# Patient Record
Sex: Female | Born: 1970 | Race: White | Hispanic: Yes | Marital: Single | Smoking: Never smoker
Health system: Southern US, Community
[De-identification: ages and names within clinical notes are randomized; demographics above are authoritative.]

---

## 2005-05-16 ENCOUNTER — Observation Stay: Payer: Self-pay

## 2008-01-18 ENCOUNTER — Encounter: Payer: Self-pay | Admitting: Maternal & Fetal Medicine

## 2008-05-31 ENCOUNTER — Inpatient Hospital Stay: Payer: Self-pay | Admitting: Obstetrics and Gynecology

## 2012-11-17 DIAGNOSIS — H9319 Tinnitus, unspecified ear: Secondary | ICD-10-CM | POA: Insufficient documentation

## 2014-04-20 ENCOUNTER — Ambulatory Visit: Payer: Self-pay

## 2014-05-31 ENCOUNTER — Ambulatory Visit: Payer: Self-pay

## 2014-10-07 ENCOUNTER — Other Ambulatory Visit: Payer: Self-pay | Admitting: Oncology

## 2014-10-07 DIAGNOSIS — N63 Unspecified lump in unspecified breast: Secondary | ICD-10-CM

## 2014-12-01 ENCOUNTER — Ambulatory Visit
Admission: RE | Admit: 2014-12-01 | Discharge: 2014-12-01 | Disposition: A | Discharge status: Home / Self Care | Payer: Self-pay | Source: Ambulatory Visit | Attending: Oncology | Admitting: Oncology

## 2014-12-01 DIAGNOSIS — N63 Unspecified lump in unspecified breast: Secondary | ICD-10-CM

## 2014-12-02 NOTE — Progress Notes (Signed)
Patients 6 month follow-up mammogram and ultrasound results, Birads 3.  Will schedule annual [facility] appointment with Bilateral diagnostic mammogram, and right breast ultrasound for probable benign right breast mass.  Copy to HSIS.

## 2015-04-26 ENCOUNTER — Ambulatory Visit
Admission: RE | Admit: 2015-04-26 | Discharge: 2015-04-26 | Disposition: A | Discharge status: Home / Self Care | Payer: 59 | Source: Ambulatory Visit | Attending: Oncology | Admitting: Oncology

## 2015-04-26 ENCOUNTER — Encounter: Payer: Self-pay | Admitting: *Deleted

## 2015-04-26 ENCOUNTER — Ambulatory Visit: Payer: Self-pay | Attending: Oncology | Admitting: *Deleted

## 2015-04-26 VITALS — BP 122/76 | HR 67 | Temp 96.3°F | Resp 18 | Ht 62.01 in | Wt 187.6 lb

## 2015-04-26 DIAGNOSIS — N63 Unspecified lump in unspecified breast: Secondary | ICD-10-CM

## 2015-04-26 NOTE — Progress Notes (Signed)
Letter mailed with appointment for next mammogram in one year per radiology recommendations.

## 2015-04-26 NOTE — Patient Instructions (Signed)
Gave patient hand-out, Women Staying Healthy, Active and Well from [facility], with education on breast health, pap smears, heart and colon health. 

## 2015-04-26 NOTE — Progress Notes (Signed)
Subjective:     Patient ID: Raymondo BandVioleta Zuniga Ramirez, female   DOB: 06/08/1971, 44 y.o.   MRN: [id number]  HPI   Review of Systems     Objective:   Physical Exam  Pulmonary/Chest: Right breast exhibits no inverted nipple, no mass, no nipple discharge, no skin change and no tenderness. Left breast exhibits no inverted nipple, no mass, no nipple discharge, no skin change and no tenderness. Breasts are symmetrical.       Assessment:     44 year old Hispanic [facility] and 6 month follow-up mammogram.  Lloyda, the interpreter present during the interview and exam.  Last mammogram on 12/01/14 was a birads 3.  On clinical breast exam there is no dominant mass, skin changes, nipple discharge or lymphadenopathy.  Taught self breast awareness.  Patient's mom with history of ovarian cancer at age 45.  Patient encouraged to get annual mammograms.  Patient has been screened for eligibility.  She does not have any insurance, Medicare or Medicaid.  She also meets financial eligibility.  Hand-out given on the Affordable Care Act.     Plan:     Bilateral diagnostic mammogram and ultrasound ordered for 6 month follow up right breast nodule and annual exam.  Will follow up per [facility] protocol.  Next pap due 2017.

## 2016-04-24 ENCOUNTER — Ambulatory Visit
Admission: RE | Admit: 2016-04-24 | Discharge: 2016-04-24 | Disposition: A | Discharge status: Home / Self Care | Payer: Self-pay | Source: Ambulatory Visit | Attending: Oncology | Admitting: Oncology

## 2016-04-24 ENCOUNTER — Other Ambulatory Visit: Payer: Self-pay

## 2016-04-24 ENCOUNTER — Encounter: Payer: Self-pay | Admitting: *Deleted

## 2016-04-24 ENCOUNTER — Ambulatory Visit: Payer: Self-pay | Attending: Oncology | Admitting: *Deleted

## 2016-04-24 VITALS — BP 109/75 | HR 61 | Temp 97.7°F | Ht 62.21 in | Wt 188.2 lb

## 2016-04-24 DIAGNOSIS — N63 Unspecified lump in unspecified breast: Secondary | ICD-10-CM

## 2016-04-24 NOTE — Patient Instructions (Signed)
Gave patient hand-out, Women Staying Healthy, Active and Well from [facility], with education on breast health, pap smears, heart and colon health. 

## 2016-04-24 NOTE — Progress Notes (Signed)
Subjective:     Patient ID: Ann Wallace, female   DOB: 12/15/1970, 45 y.o.   MRN: [id number]  HPI   Review of Systems     Objective:   Physical Exam  Pulmonary/Chest: Right breast exhibits no inverted nipple, no mass, no nipple discharge, no skin change and no tenderness. Left breast exhibits no inverted nipple, no mass, no nipple discharge, no skin change and no tenderness. Breasts are symmetrical.       Assessment:     45 [facility] Hispanic [facility]  Last mammogram 04/26/15 was a birads 3.  Lloyda the interpreter present during the interview and exam.  On clinical breast exam there is no dominant mass, skin changes, nipple discharge or lymphadenopathy.  She does complain of tenderness in the left breast.  Taught self breast awareness.  Patient with significant family history of cancer.  Her mom was diagnosed with ovarian cancer at age 46, and her sister was just diagnosed with breast cancer at age 46.  Discussed need for possible genetic testing based on family history.  She is to talk with her sister to see if she has been tested.  She is to call me back, and we will discuss possibility of getting her tested through [facility].  [facility] has a program for testing the uninsured.  Will discuss with Dr. Orlie DakinFinnegan, our medical director to see if he would agree to test her and set up a medical management plan if necessary.  Patient has been screened for eligibility.  She does not have any insurance, Medicare or Medicaid.  She also meets financial eligibility.  Hand-out given on the Affordable Care Act.    Plan:     Bilateral diagnostic mammogram with ultrasound ordered for birads 3 follow-up.  Will discuss genetic testing when patient calls me back.  Will follow-up per [facility] protocol.

## 2016-05-07 ENCOUNTER — Encounter: Payer: Self-pay | Admitting: *Deleted

## 2016-05-07 NOTE — Progress Notes (Signed)
Letter mailed to inform patient of her normal mammogram.  She is to follow-up in one year with annual screening.  HSIS to Christy. 

## 2017-04-30 ENCOUNTER — Encounter: Payer: Self-pay | Admitting: *Deleted

## 2017-04-30 ENCOUNTER — Ambulatory Visit: Payer: Self-pay | Attending: Oncology | Admitting: *Deleted

## 2017-04-30 ENCOUNTER — Ambulatory Visit
Admission: RE | Admit: 2017-04-30 | Discharge: 2017-04-30 | Disposition: A | Discharge status: Home / Self Care | Payer: Self-pay | Source: Ambulatory Visit | Attending: Oncology | Admitting: Oncology

## 2017-04-30 VITALS — BP 139/92 | HR 71 | Temp 97.6°F | Resp 18 | Ht 62.0 in | Wt 193.0 lb

## 2017-04-30 DIAGNOSIS — Z0000 Encounter for general adult medical examination without abnormal findings: Secondary | ICD-10-CM

## 2017-04-30 NOTE — Patient Instructions (Signed)
Hipertensin Hypertension La hipertensin, conocida comnmente como presin arterial alta, se produce cuando la sangre bombea en las arterias con mucha fuerza. Las arterias son los vasos sanguneos que transportan la sangre desde el corazn al resto del cuerpo. La hipertensin hace que el corazn haga ms esfuerzo para bombear sangre y puede provocar que las arterias se estrechen o endurezcan. La hipertensin no tratada o no controlada puede causar infarto de miocardio, accidentes cerebrovasculares, enfermedad renal y otros problemas. Una lectura de la presin arterial consiste de un nmero ms alto sobre un nmero ms bajo. En condiciones ideales, la presin arterial debe estar por debajo de 120/80. El primer nmero ("superior") es la presin sistlica. Es la medida de la presin de las arterias cuando el corazn late. El segundo nmero ("inferior") es la presin diastlica. Es la medida de la presin en las arterias cuando el corazn se relaja. Cules son las causas? Se desconoce la causa de esta afeccin. Qu incrementa el riesgo? Algunos factores de riesgo de hipertensin estn bajo su control. Otros no. Factores que puede modificar  Fumar.  Tener diabetes mellitus tipo 2, colesterol alto, o ambos.  No hacer la cantidad suficiente de actividad fsica o ejercicio.  Tener sobrepeso.  Consumir mucha grasa, azcar, caloras o sal (sodio) en su dieta.  Beber alcohol en exceso. Factores que son difciles o imposibles de modificar  Tener enfermedad renal crnica.  Tener antecedentes familiares de presin arterial alta.  La edad. Los riesgos aumentan con la edad.  La raza. El riesgo es mayor para las personas afroamericanas.  El sexo. Antes de los 45aos, los hombres corren ms riesgo que las mujeres. Despus de los 65aos, las mujeres corren ms riesgo que los hombres.  Tener apnea obstructiva del sueo.  El estrs. Cules son los signos o los sntomas? La presin arterial  extremadamente alta (crisis hipertensiva) puede provocar:  Dolor de cabeza.  Ansiedad.  Falta de aire.  Hemorragia nasal.  Nuseas y vmitos.  Dolor de pecho intenso.  Una crisis de movimientos que no puede controlar (convulsiones).  Cmo se diagnostica? Esta afeccin se diagnostica midiendo su presin arterial mientras se encuentra sentado, con el brazo apoyado sobre una superficie. El brazalete del tensimetro debe colocarse directamente sobre la piel de la parte superior del brazo y al nivel de su corazn. Debe medirla al menos dos veces en el mismo brazo. Determinadas condiciones pueden causar una diferencia de presin arterial entre el brazo izquierdo y el derecho. Ciertos factores pueden provocar que las lecturas de la presin arterial sean inferiores o superiores a lo normal (elevadas) por un perodo corto de tiempo:  Si su presin arterial es ms alta cuando se encuentra en el consultorio del mdico que cuando la mide en su hogar, se denomina "hipertensin de bata blanca". La mayora de las personas que tienen esta afeccin no deben ser medicadas.  Si su presin arterial es ms alta en el hogar que cuando se encuentra en el consultorio del mdico, se denomina "hipertensin enmascarada". La mayora de las personas que tienen esta afeccin deben ser medicadas para controlar la presin arterial.  Si tiene una lecturas de presin arterial alta durante una visita o si tiene presin arterial normal con otros factores de riesgo:  Es posible que se le pida que regrese otro da para volver a controlar su presin arterial.  Se le puede pedir que se controle la presin arterial en su casa durante 1 semana o ms.  Si se le diagnostica hipertensin, es posible que   se le realicen otros anlisis de sangre o estudios de diagnstico por imgenes para ayudar a su mdico a comprender su riesgo general de tener otras afecciones. Cmo se trata? Esta afeccin se trata haciendo cambios saludables  en el estilo de vida, tales como ingerir alimentos saludables, realizar ms ejercicio y reducir el consumo de alcohol. El mdico puede recetarle medicamentos si los cambios en el estilo de vida no son suficientes para lograr controlar la presin arterial y si:  Su presin arterial sistlica est por encima de 130.  Su presin arterial diastlica est por encima de 80.  La presin arterial deseada puede variar en funcin de las enfermedades, la edad y otros factores personales. Siga estas instrucciones en su casa: Comida y bebida  Siga una dieta con alto contenido de fibras y potasio, y con bajo contenido de sodio, azcar agregada y grasas. Un ejemplo de plan alimenticio es la dieta DASH (Dietary Approaches to Stop Hypertension, Mtodos alimenticios para detener la hipertensin). Para alimentarse de esta manera: ? Coma mucha fruta y verdura fresca. Trate de que la mitad del plato de cada comida sea de frutas y verduras. ? Coma cereales integrales, como pasta integral, arroz integral y pan integral. Llene aproximadamente un cuarto del plato con cereales integrales. ? Coma y beba productos lcteos con bajo contenido de grasa, como leche descremada o yogur bajo en grasas. ? Evite la ingesta de cortes de carne grasa, carne procesada o curada, y carne de ave con piel. Llene aproximadamente un cuarto del plato con protenas magras, como pescado, pollo sin piel, frijoles, huevos y tofu. ? Evite ingerir alimentos prehechos o procesados. En general, estos tienen mayor cantidad de sodio, azcar agregada y grasa.  Reduzca su ingesta diaria de sodio. La mayora de las personas que tienen hipertensin deben comer menos de 1500 mg de sodio por da.  Limite el consumo de alcohol a no ms de 1 medida por da si es mujer y no est embarazada y a 2 medidas por da si es hombre. Una medida equivale a 12onzas de cerveza, 5onzas de vino o 1onzas de bebidas alcohlicas de alta graduacin. Estilo de vida  Trabaje  con su mdico para mantener un peso saludable o perder peso. Pregntele cual es su peso recomendado.  Realice al menos 30 minutos de ejercicio que haga que se acelere su corazn (ejercicio aerbico) la mayora de los das de la semana. Estas actividades pueden incluir caminar, nadar o andar en bicicleta.  Incluya ejercicios para fortalecer sus msculos (ejercicios de resistencia), como pilates o levantamiento de pesas, como parte de su rutina semanal de ejercicios. Intente realizar 30minutos de este tipo de ejercicios al menos tres das a la semana.  No consuma ningn producto que contenga nicotina o tabaco, como cigarrillos y cigarrillos electrnicos. Si necesita ayuda para dejar de fumar, consulte al mdico.  Contrlese la presin arterial en su casa segn las indicaciones del mdico.  Concurra a todas las visitas de control como se lo haya indicado el mdico. Esto es importante. Medicamentos  Tome los medicamentos de venta libre y los recetados solamente como se lo haya indicado el mdico. Siga cuidadosamente las indicaciones. Los medicamentos para la presin arterial deben tomarse segn las indicaciones.  No omita las dosis de medicamentos para la presin arterial. Si lo hace, estar en riesgo de tener problemas y puede hacer que los medicamentos sean menos eficaces.  Pregntele a su mdico a qu efectos secundarios o reacciones a los medicamentos debe prestar atencin. Comunquese con   un mdico si:  Piensa que tiene una reaccin a un medicamento que est tomando.  Tiene dolores de cabeza frecuentes (recurrentes).  Siente mareos.  Tiene hinchazn en los tobillos.  Tiene problemas de visin. Solicite ayuda de inmediato si:  Siente un dolor de cabeza intenso o confusin.  Siente debilidad inusual o adormecimiento.  Siente que va a desmayarse.  Siente un dolor intenso en el pecho o el abdomen.  Vomita repetidas veces.  Tiene dificultad para respirar. Resumen  La  hipertensin se produce cuando la sangre bombea en las arterias con mucha fuerza. Si esta afeccin no se controla, podra correr riesgo de tener complicaciones graves.  La presin arterial deseada puede variar en funcin de las enfermedades, la edad y otros factores personales. Para la mayora de las personas, una presin arterial normal es menor que 120/80.  La hipertensin se trata con cambios en el estilo de vida, medicamentos o una combinacin de ambos. Los cambios en el estilo de vida incluyen prdida de peso, ingerir alimentos sanos, seguir una dieta baja en sodio, hacer ms ejercicio y limitar el consumo de alcohol. Esta informacin no tiene como fin reemplazar el consejo del mdico. Asegrese de hacerle al  mdico cualquier pregunta que tenga. Document Released: 06/10/2005 Document Revised: 05/22/2016 Document Reviewed: 05/22/2016 Elsevier Interactive Patient Education  2018 [employer]vier Inc.  Gave patient hand-out, Women Staying Healthy, Active and Well from [facility], with education on breast health, pap smears, heart and colon health. 

## 2017-04-30 NOTE — Progress Notes (Signed)
Subjective:     Patient ID: Raymondo BandVioleta Zuniga Ramirez, female   DOB: 01/17/1971, 46 y.o.   MRN: [id number]  HPI   Review of Systems     Objective:   Physical Exam  Pulmonary/Chest: Right breast exhibits tenderness. Right breast exhibits no inverted nipple, no mass, no nipple discharge and no skin change. Left breast exhibits tenderness. Left breast exhibits no mass, no nipple discharge and no skin change. Breasts are symmetrical.    Bilateral tenderness on palpation       Assessment:     46 [facility] Hispanic [facility]  Lloyda, the interpreter present during the interview and exam.  Clinical breast exam unremarkable.  Taught self breast awareness.  Patient states she has bilateral tenderness, but only to palpation.  Family history of breast cancer in her sister at age 47, and her mom with uterine cancer at age 47.  States she did ask her sister if she had genetic testing and she did not have any testing completed.  Encouraged annual screening and monthly self breast awareness due to increased risk of breast cancer.  She is agreeable. Last pap on 11/14/14 was negative with no HPV co-testing.  Informed next pap will be due in 2019.  Patient has been screened for eligibility.  She does not have any insurance, Medicare or Medicaid.  She also meets financial eligibility.  Hand-out given on the Affordable Care Act.    Plan:     Screening mammogram ordered.  Will follow-up per [facility] protocol.

## 2017-05-06 ENCOUNTER — Encounter: Payer: Self-pay | Admitting: *Deleted

## 2017-05-06 NOTE — Progress Notes (Signed)
Letter mailed from [facility] to inform patient of her normal mammogram results.  Patient is to follow-up with annual screening in one year.  [facility]IS to Christy. 

## 2018-05-25 ENCOUNTER — Ambulatory Visit: Payer: Self-pay | Attending: Oncology | Admitting: *Deleted

## 2018-05-25 ENCOUNTER — Encounter (INDEPENDENT_AMBULATORY_CARE_PROVIDER_SITE_OTHER): Payer: Self-pay

## 2018-05-25 ENCOUNTER — Encounter: Payer: Self-pay | Admitting: *Deleted

## 2018-05-25 ENCOUNTER — Ambulatory Visit
Admission: RE | Admit: 2018-05-25 | Discharge: 2018-05-25 | Disposition: A | Discharge status: Home / Self Care | Payer: 59 | Source: Ambulatory Visit | Attending: Oncology | Admitting: Oncology

## 2018-05-25 VITALS — BP 111/75 | HR 76 | Temp 98.0°F

## 2018-05-25 DIAGNOSIS — Z0000 Encounter for general adult medical examination without abnormal findings: Secondary | ICD-10-CM

## 2018-05-25 NOTE — Patient Instructions (Signed)
Gave patient hand-out, Women Staying Healthy, Active and Well from [facility], with education on breast health, pap smears, heart and colon health. 

## 2018-05-25 NOTE — Progress Notes (Signed)
  Subjective:     Patient ID: Ann Wallace, female   DOB: 03/24/1971, 47 y.o.   MRN: [id number]  HPI   Review of Systems     Objective:   Physical Exam  Pulmonary/Chest: Right breast exhibits no inverted nipple, no mass, no nipple discharge, no skin change and no tenderness. Left breast exhibits no inverted nipple, no mass, no nipple discharge, no skin change and no tenderness.       Assessment:     47 [facility] Hispanic [facility]  Ann Wallace the interpreter present during the interview and exam.  Clinical breast exam unremarkable. Taught self breast awareness.  Patient with family history of breast cancer in her sister at age 48, and her mom with uterine cancer at age 48.  [facility]  The results are not available for review.  She is to follow up next pap per results and [facility] guidelines.  Patient has been screened for eligibility.  She does not have any insurance, Medicare or Medicaid.  She also meets financial eligibility.  Hand-out given on the Affordable Care Act. Risk Assessment    Risk Scores      05/25/2018   Last edited by: Jim LikeLambert, Sheena M, RN   5-year risk: 1.3 %   Lifetime risk: 12.5 %            Plan:     Screening mammogram ordered.  Will follow-up per [facility] protocol.

## 2018-05-26 ENCOUNTER — Encounter: Payer: Self-pay | Admitting: *Deleted

## 2018-05-26 NOTE — Progress Notes (Signed)
Letter mailed from [facility] to inform patient of her normal mammogram results.  Patient is to follow-up with annual screening in one year.  [facility]IS to Christy. 

## 2018-05-29 ENCOUNTER — Encounter: Payer: Self-pay | Admitting: Family Medicine

## 2019-05-28 ENCOUNTER — Telehealth: Payer: Self-pay

## 2019-05-28 NOTE — Telephone Encounter (Signed)
Pre-visit [facility] call was placed by interpreter: Carmina Miller. No answer x 2. Did not leave a msg.

## 2019-06-01 ENCOUNTER — Ambulatory Visit
Admission: RE | Admit: 2019-06-01 | Discharge: 2019-06-01 | Disposition: A | Discharge status: Home / Self Care | Payer: 59 | Source: Ambulatory Visit | Attending: Oncology | Admitting: Oncology

## 2019-06-01 ENCOUNTER — Other Ambulatory Visit: Payer: Self-pay

## 2019-06-01 ENCOUNTER — Ambulatory Visit: Payer: Self-pay | Attending: Oncology | Admitting: *Deleted

## 2019-06-01 ENCOUNTER — Encounter: Payer: Self-pay | Admitting: *Deleted

## 2019-06-01 DIAGNOSIS — Z0000 Encounter for general adult medical examination without abnormal findings: Secondary | ICD-10-CM | POA: Insufficient documentation

## 2019-06-01 NOTE — Progress Notes (Signed)
Prescreened patient for [facility] due to COVID restrictions.  Verbal Consent given. Patient to arrive at [facility] at 10:30 for screening mammogram.

## 2019-06-01 NOTE — Progress Notes (Signed)
Letter mailed from [facility] to inform patient of her normal mammogram results.  Patient is to follow-up with annual screening in one year.  [facility]IS to Christy. 

## 2019-06-01 NOTE — Progress Notes (Signed)
  Subjective:     Patient ID: Ann Wallace, female   DOB: 09-04-1970, 48 y.o.   MRN: [id number]  HPI   Review of Systems     Objective:   Physical Exam     Assessment:     Due to Covid 19 a televisit was used to enroll patient into our [facility] program and complete her health history.  Verbal consent given to Al Pimple, RN. Patient denies any breast problems. Patient presented directly to the [facility] for her screening mammogram.  Last pap on 01/24/18 was negative without HPV co-testing.  Next pap due in 2021.   Risk Assessment    Risk Scores      06/01/2019 05/25/2018   Last edited by: Theodore Demark, RN Rico Junker, RN   5-year risk: 1.3 % 1.3 %   Lifetime risk: 12.3 % 12.5 %            Plan:     Screening mammogram ordered.  Will follow up per [facility] protocol.

## 2019-09-11 ENCOUNTER — Ambulatory Visit: Payer: Self-pay | Attending: Internal Medicine

## 2019-09-11 DIAGNOSIS — Z23 Encounter for immunization: Secondary | ICD-10-CM

## 2019-09-11 NOTE — Progress Notes (Signed)
   Covid-19 Vaccination Clinic  Name:  Evianna Chandran    MRN: [id number] DOB: Dec 21, 1970  09/11/2019  Ms. Genelle Economou was observed post Covid-19 immunization for 15 minutes without incident. She was provided with Vaccine Information Sheet and instruction to access the V-Safe system.   Ms. Atheena Spano was instructed to call 911 with any severe reactions post vaccine: Marland Kitchen Difficulty breathing  . Swelling of face and throat  . A fast heartbeat  . A bad rash all over body  . Dizziness and weakness   Immunizations Administered    Name Date Dose VIS Date Route   Pfizer COVID-19 Vaccine 09/11/2019  3:49 PM 0.3 mL 06/04/2019 Intramuscular   Manufacturer: [employer], [employer]   Lot: [id number]   NDC: [address]-0

## 2019-10-02 ENCOUNTER — Ambulatory Visit: Payer: Self-pay | Attending: Internal Medicine

## 2019-10-02 DIAGNOSIS — Z23 Encounter for immunization: Secondary | ICD-10-CM

## 2019-10-02 NOTE — Progress Notes (Signed)
   Covid-19 Vaccination Clinic  Name:  Donna Snooks    MRN: [id number] DOB: [birth year]  10/02/2019  Ms. Amariyah Bazar was observed post Covid-19 immunization for 15 minutes without incident. She was provided with Vaccine Information Sheet and instruction to access the V-Safe system.   Ms. Clarke Amburn was instructed to call 911 with any severe reactions post vaccine: Marland Kitchen Difficulty breathing  . Swelling of face and throat  . A fast heartbeat  . A bad rash all over body  . Dizziness and weakness   Immunizations Administered    Name Date Dose VIS Date Route   Pfizer COVID-19 Vaccine 10/02/2019  3:42 PM 0.3 mL 06/04/2019 Intramuscular   Manufacturer: [employer], [employer]   Lot: [id number]   NDC: [address]-1

## 2020-06-07 ENCOUNTER — Other Ambulatory Visit: Payer: Self-pay

## 2020-06-07 ENCOUNTER — Ambulatory Visit
Admission: RE | Admit: 2020-06-07 | Discharge: 2020-06-07 | Disposition: A | Discharge status: Home / Self Care | Payer: 59 | Source: Ambulatory Visit | Attending: Oncology | Admitting: Oncology

## 2020-06-07 ENCOUNTER — Encounter: Payer: Self-pay | Admitting: *Deleted

## 2020-06-07 ENCOUNTER — Ambulatory Visit: Payer: Self-pay | Attending: Oncology | Admitting: *Deleted

## 2020-06-07 VITALS — BP 128/79 | HR 66 | Temp 98.2°F | Ht 61.16 in | Wt 179.7 lb

## 2020-06-07 DIAGNOSIS — Z0000 Encounter for general adult medical examination without abnormal findings: Secondary | ICD-10-CM | POA: Insufficient documentation

## 2020-06-07 NOTE — Progress Notes (Signed)
Subjective:     Patient ID: Ann Wallace, female   DOB: [birth year], 49 y.o.   MRN: [id number]  HPI   [facility] Medical History Record - 06/07/20 7902      Breast History   Screening cycle New    Provider ([facility]    Initial Mammogram 06/07/20    Last Mammogram Annual    Last Mammogram Date 06/01/19    Provider (Mammogram)  Ann Wallace    Recent Breast Symptoms None      Breast Cancer History   Breast Cancer History Patient and mother/daughter/sister have had breast cancer    Comments/Details sister diagnosed with breast cancer 4 years ago at age 34      Previous History of Breast Problems   Breast Surgery or Biopsy None    Breast Implants N/A    BSE Done Monthly      Gynecological/Obstetrical History   LMP --   49 years old   Is there any chance that the client could be pregnant?  No   age 49   Age at menarche 29    Age at menopause 66    PAP smear history Annually    Date of last PAP  01/23/18    Provider (PAP) Ann Wallace    Age at first live birth 49    Breast fed children Yes (type length in comments)   4 months   DES Exposure No    Cervical, Uterine or Ovarian cancer No    Family history of Cervial, Uterine or Ovarian cancer Yes   mother cervical at 2   Hysterectomy No    Cervix removed No    Ovaries removed No    Laser/Cryosurgery No    Current method of birth control None    Current method of Estrogen/Hormone replacement None    Smoking history None    Comments Tyrer Cuzik score 23.1            Review of Systems     Objective:   Physical Exam Chest:  Breasts:     Right: No swelling, bleeding, inverted nipple, mass, nipple discharge, skin change, tenderness, axillary adenopathy or supraclavicular adenopathy.     Left: No swelling, bleeding, inverted nipple, mass, nipple discharge, skin change, tenderness, axillary adenopathy or supraclavicular adenopathy.    Lymphadenopathy:     Upper Body:     Right upper body: No  supraclavicular or axillary adenopathy.     Left upper body: No supraclavicular or axillary adenopathy.        Assessment:     49 year old Hispanic female returns to [facility] screening.  Ann Wallace, the interpreter present during the interview and exam.  Clinical breast exam unremarkable.  Taught self breast awareness.  Last pap on 02/03/18 was negative without HPV co-testing.  Next pap due in 2022.  Patient has been screened for eligibility.  She does not have any insurance, Medicare or Medicaid.  She also meets financial eligibility.   Risk Assessment    Risk Scores      06/07/2020 06/01/2019   Last edited by: Ann Presto, RN Ann Presto, RN   5-year risk: 1.4 % 1.3 %   Lifetime risk: 12.1 % 12.3 %        Tyrer-Cuzick breast cancer risk assessment with a lifetime risk of 23.1%.  Per NCCN guidelines modified imaging is recommended.  Patient's sister has had genetic testing.  Patient is not sure of her results.  Plan:     Screening mammogram ordered.  Discussed referral to the high risk breast clinic for her lifetime risk of 23.1%.  She is going to think about it and let Ann Wallace know if she wants to pursue a consultation.  Will follow up per [facility] protocol.

## 2020-06-07 NOTE — Patient Instructions (Signed)
Gave patient hand-out, Women Staying Healthy, Active and Well from [facility], with education on breast health, pap smears, heart and colon health. 

## 2020-06-13 ENCOUNTER — Encounter: Payer: Self-pay | Admitting: *Deleted

## 2020-06-20 ENCOUNTER — Encounter: Payer: Self-pay | Admitting: *Deleted

## 2020-06-20 NOTE — Progress Notes (Signed)
Letter mailed from [facility] to inform patient of her normal mammogram results.  Patient is to follow-up with annual screening in one year. 

## 2021-03-20 IMAGING — MG DIGITAL SCREENING BILAT W/ TOMO W/ CAD
8 series · 8 of 24 positions shown · non-contrast
Comparison: Previous exam(s).

CLINICAL DATA: Screening.

EXAM:
DIGITAL SCREENING BILATERAL MAMMOGRAM WITH TOMO AND CAD

[L MLO synth-2D]
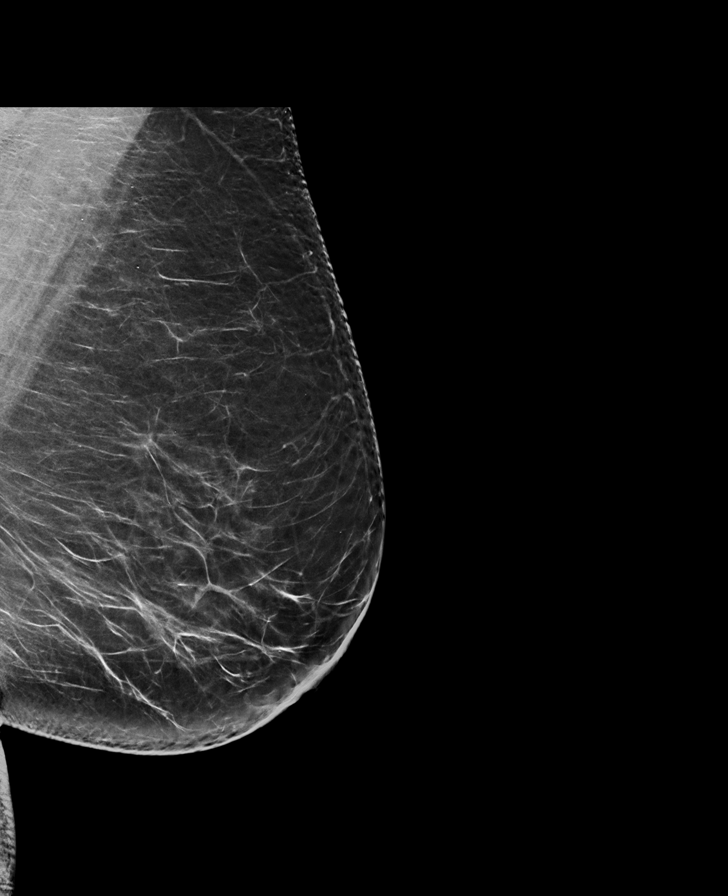

[R CC synth-2D]
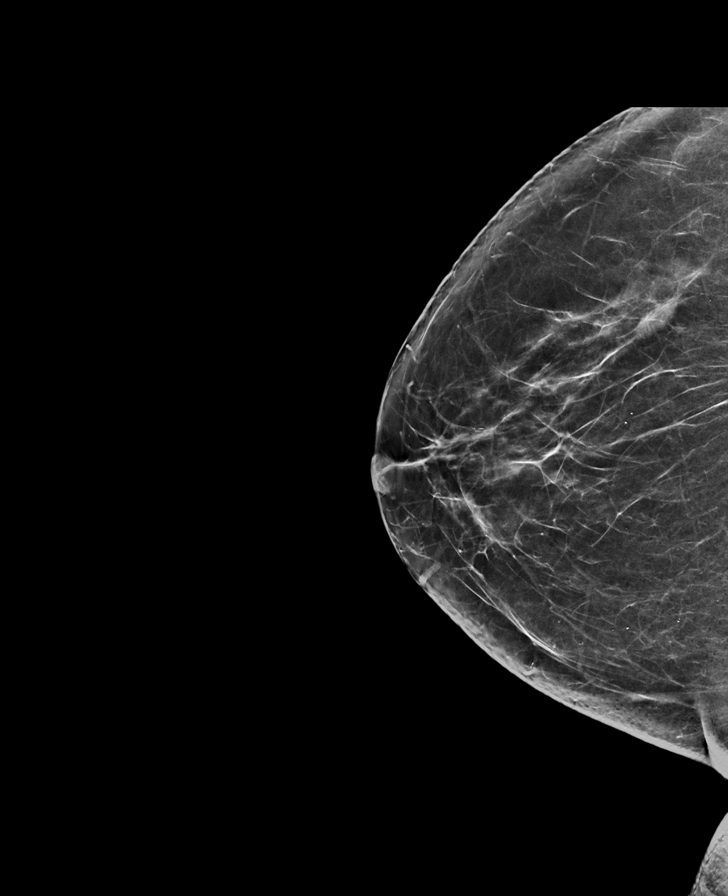

[R MLO synth-2D]
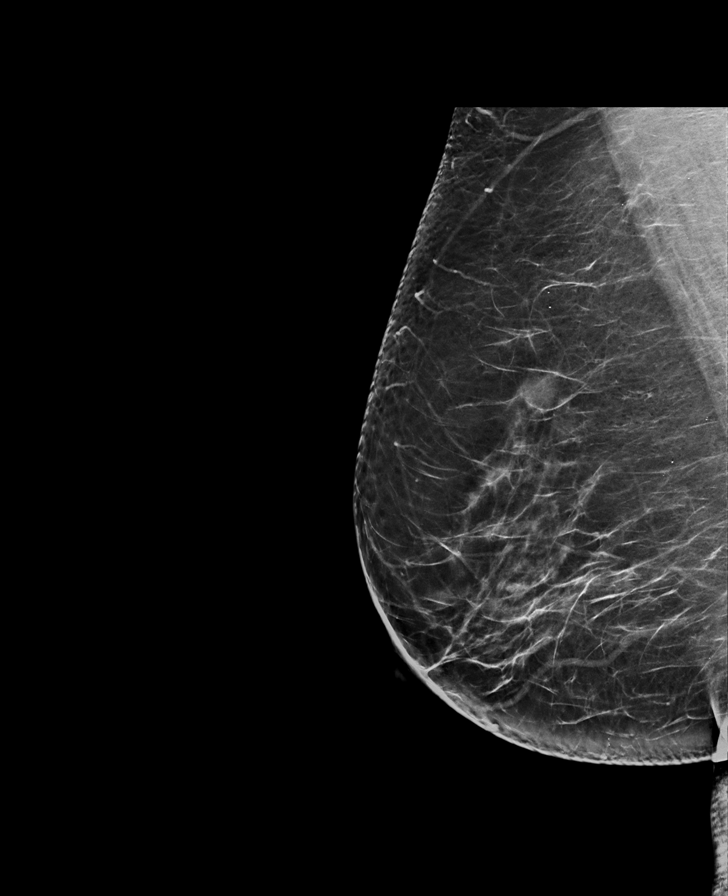

[L CC synth-2D]
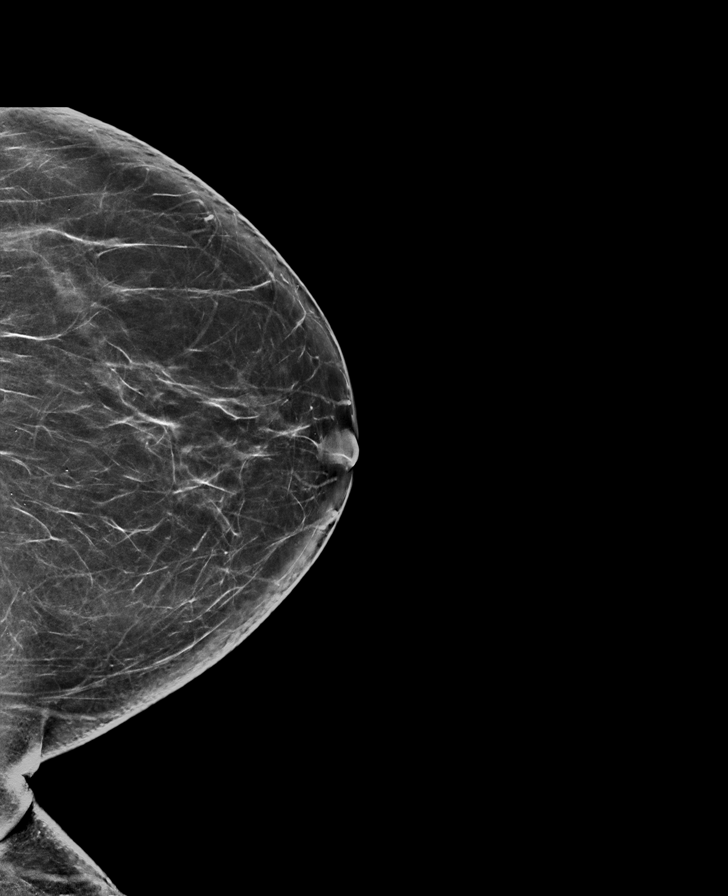

[L CC tomo · tomo slice 38/75.0]
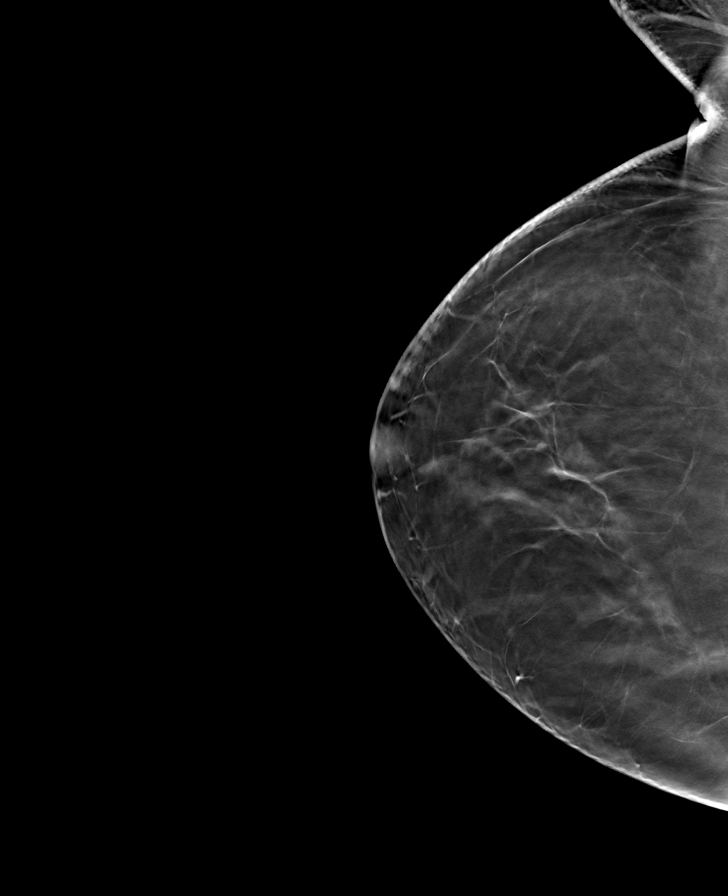

[L MLO tomo · tomo slice 41/82.0]
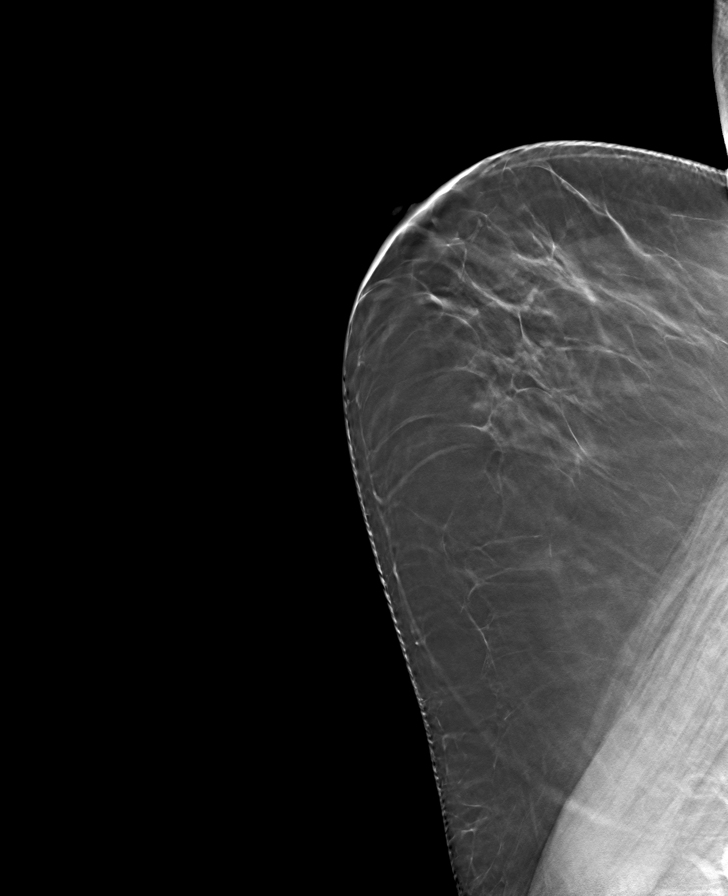

[R MLO tomo · tomo slice 41/80.0]
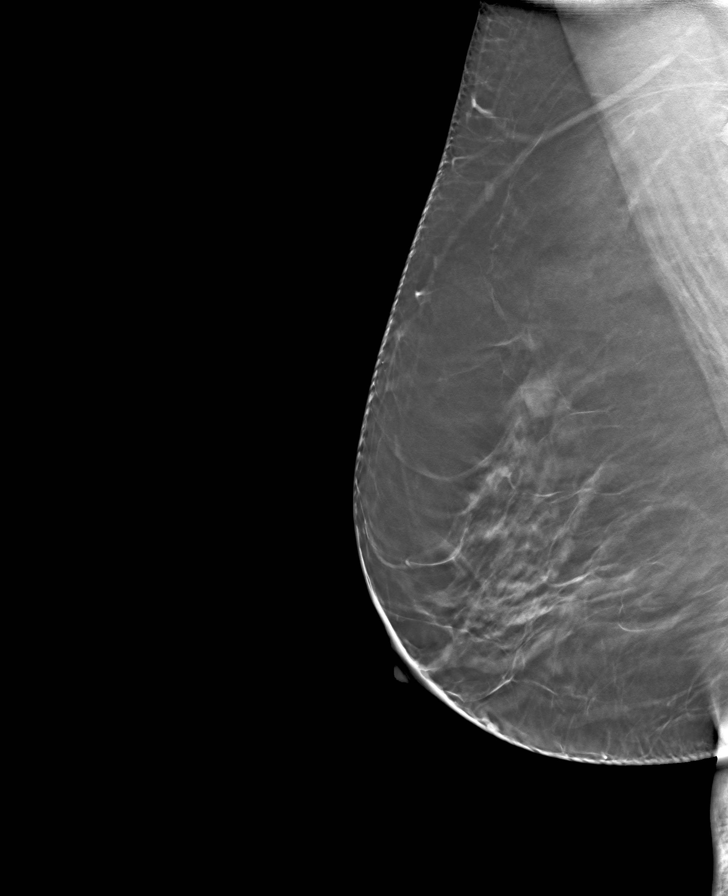

[R CC tomo · tomo slice 36/71.0]
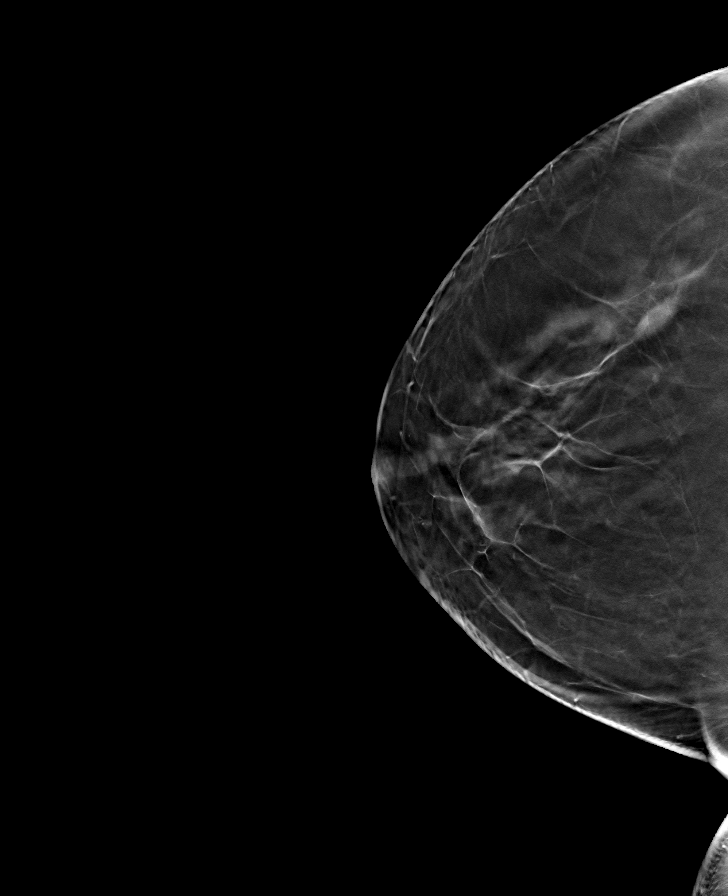

[8 of 24 positions shown; findings below may reference images not displayed]

ACR Breast Density Category b: There are scattered areas of
fibroglandular density.
FINDINGS: There are no findings suspicious for malignancy. Images were
processed with CAD.
IMPRESSION: No mammographic evidence of malignancy. A result letter of this
screening mammogram will be mailed directly to the patient.

RECOMMENDATION:
Screening mammogram in one year. (Code:[id number]-775)

BI-RADS CATEGORY  1: Negative.

## 2021-07-11 ENCOUNTER — Other Ambulatory Visit: Payer: Self-pay

## 2021-07-11 DIAGNOSIS — Z1231 Encounter for screening mammogram for malignant neoplasm of breast: Secondary | ICD-10-CM

## 2021-08-14 ENCOUNTER — Ambulatory Visit: Payer: Self-pay | Attending: Oncology

## 2021-08-14 DIAGNOSIS — Z1211 Encounter for screening for malignant neoplasm of colon: Secondary | ICD-10-CM

## 2021-10-30 ENCOUNTER — Ambulatory Visit
Admission: RE | Admit: 2021-10-30 | Discharge: 2021-10-30 | Disposition: A | Discharge status: Home / Self Care | Payer: 59 | Source: Ambulatory Visit | Attending: Obstetrics and Gynecology | Admitting: Obstetrics and Gynecology

## 2021-10-30 ENCOUNTER — Ambulatory Visit: Payer: Self-pay | Attending: Hematology and Oncology | Admitting: *Deleted

## 2021-10-30 VITALS — BP 125/70 | HR 68 | Resp 18 | Wt 189.0 lb

## 2021-10-30 DIAGNOSIS — Z1231 Encounter for screening mammogram for malignant neoplasm of breast: Secondary | ICD-10-CM | POA: Insufficient documentation

## 2021-10-30 DIAGNOSIS — Z01419 Encounter for gynecological examination (general) (routine) without abnormal findings: Secondary | ICD-10-CM

## 2021-10-30 NOTE — Progress Notes (Signed)
[name] Ann Wallace is a 51 y.o. female who presents to [facility] clinic today with no complaints She is here for her annual well woman visit.  ?  ?Pap Smear: Pap not smear completed today. Last Pap smear was on 06/21/21 at the [facility]  clinic and was normal per patient.  There is no pap result for review.   Per patient has no history of an abnormal Pap smear. Last Pap smear result is not available in Epic. ?  ?Physical exam: ?Breasts ?Breasts symmetrical. No skin abnormalities bilateral breasts. No nipple retraction bilateral breasts. No nipple discharge bilateral breasts. No lymphadenopathy. No lumps palpated bilateral breasts.     ?  ?Pelvic/Bimanual ?Pap is not indicated today  ?  ?Smoking History: ?Patient has never smoked No referral to quit line.  ?  ?Patient Navigation: ?Patient education provided. Access to services provided for patient through [facility] program. Claretha Cooper, the interpreter provided interpretation.  No transportation provided  ? ?Colorectal Cancer Screening: ?Per patient has never had colonoscopy completed.  But states she had a FIT test last year at the clinic.  No complaints today.  ?  ?Breast and Cervical Cancer Risk Assessment: ?Patient has family history of breast cancer, in her sister at age 104.  No known genetic mutations, or radiation treatment to the chest before age 72. Patient does not have history of cervical dysplasia, immunocompromised, or DES exposure in-utero.  Mom has a history of uterine cancer. ? ?Risk Assessment   ? ? Risk Scores   ? ?   10/30/2021 06/07/2020  ? Last edited by: Lesle Chris, RN Scarlett Presto, RN  ? 5-year risk: 1.5 % 1.4 %  ? Lifetime risk: 11.7 % 12.1 %  ? ?  ?  ? ?  ? ? ?A: ?BCCCP exam without pap smear ? ? ?P: ?Referred patient to the [facility]  for a screening mammogram. Appointment scheduled for today. ? ?Jim Like, RN ?10/30/2021 2:53 PM   ?

## 2022-10-16 ENCOUNTER — Other Ambulatory Visit: Payer: Self-pay

## 2022-10-16 DIAGNOSIS — Z1231 Encounter for screening mammogram for malignant neoplasm of breast: Secondary | ICD-10-CM

## 2022-11-11 ENCOUNTER — Ambulatory Visit: Payer: 59 | Attending: Hematology and Oncology | Admitting: Hematology and Oncology

## 2022-11-11 ENCOUNTER — Ambulatory Visit
Admission: RE | Admit: 2022-11-11 | Discharge: 2022-11-11 | Disposition: A | Discharge status: Home / Self Care | Payer: 59 | Source: Ambulatory Visit | Attending: Obstetrics and Gynecology | Admitting: Obstetrics and Gynecology

## 2022-11-11 VITALS — BP 132/73 | Wt 186.7 lb

## 2022-11-11 DIAGNOSIS — Z1231 Encounter for screening mammogram for malignant neoplasm of breast: Secondary | ICD-10-CM | POA: Insufficient documentation

## 2022-11-11 NOTE — Progress Notes (Signed)
[name] is a 52 y.o. female who presents to [facility] clinic today with no complaints.    Pap Smear: Pap smear not completed today. Last Pap smear was 06/21/2021 at [facility] clinic and was normal. Per patient has no history of an abnormal Pap smear. Last Pap smear result is available in Epic.   Physical exam: Breasts Breasts symmetrical. No skin abnormalities bilateral breasts. No nipple retraction bilateral breasts. No nipple discharge bilateral breasts. No lymphadenopathy. No lumps palpated bilateral breasts.   MS DIGITAL SCREENING TOMO BILATERAL  Result Date: 10/31/2021 CLINICAL DATA:  Screening. EXAM: DIGITAL SCREENING BILATERAL MAMMOGRAM WITH TOMOSYNTHESIS AND CAD TECHNIQUE: Bilateral screening digital craniocaudal and mediolateral oblique mammograms were obtained. Bilateral screening digital breast tomosynthesis was performed. The images were evaluated with computer-aided detection. COMPARISON:  Previous exam(s). ACR Breast Density Category b: There are scattered areas of fibroglandular density. FINDINGS: There are no findings suspicious for malignancy. IMPRESSION: No mammographic evidence of malignancy. A result letter of this screening mammogram will be mailed directly to the patient. RECOMMENDATION: Screening mammogram in one year. (Code:SM-B-01Y) BI-RADS CATEGORY  1: Negative. Electronically Signed   By: Ann Wallace M.D.   On: 10/31/2021 10:09   MS DIGITAL SCREENING TOMO BILATERAL  Result Date: 06/12/2020 CLINICAL DATA:  Screening. EXAM: DIGITAL SCREENING BILATERAL MAMMOGRAM WITH TOMO AND CAD COMPARISON:  Previous exam(s). ACR Breast Density Category b: There are scattered areas of fibroglandular density. FINDINGS: There are no findings suspicious for malignancy. Images were processed with CAD. IMPRESSION: No mammographic evidence of malignancy. A result letter of this screening mammogram will be mailed directly to the patient. RECOMMENDATION: Screening mammogram in  one year. (Code:SM-B-01Y) BI-RADS CATEGORY  1: Negative. Electronically Signed   By: Ann Wallace M.D.   On: 06/12/2020 10:43   MS DIGITAL SCREENING TOMO BILATERAL  Result Date: 06/01/2019 CLINICAL DATA:  Screening. EXAM: DIGITAL SCREENING BILATERAL MAMMOGRAM WITH TOMO AND CAD COMPARISON:  Previous exam(s). ACR Breast Density Category b: There are scattered areas of fibroglandular density. FINDINGS: There are no findings suspicious for malignancy. Images were processed with CAD. IMPRESSION: No mammographic evidence of malignancy. A result letter of this screening mammogram will be mailed directly to the patient. RECOMMENDATION: Screening mammogram in one year. (Code:SM-B-01Y) BI-RADS CATEGORY  1: Negative. Electronically Signed   By: Ann Wallace M.D.   On: 06/01/2019 13:54   MS DIGITAL SCREENING TOMO BILATERAL  Result Date: 05/25/2018 CLINICAL DATA:  Screening. EXAM: DIGITAL SCREENING BILATERAL MAMMOGRAM WITH TOMO AND CAD COMPARISON:  Previous exam(s). ACR Breast Density Category b: There are scattered areas of fibroglandular density. FINDINGS: There are no findings suspicious for malignancy. Images were processed with CAD. IMPRESSION: No mammographic evidence of malignancy. A result letter of this screening mammogram will be mailed directly to the patient. RECOMMENDATION: Screening mammogram in one year. (Code:SM-B-01Y) BI-RADS CATEGORY  1: Negative. Electronically Signed   By: Ann Wallace M.D.   On: 05/25/2018 13:08        Pelvic/Bimanual Pap is not indicated today    Smoking History: Patient has never smoked and was not referred to quit line.    Patient Navigation: Patient education provided. Access to services provided for patient through BCCCP program. Ann Wallace interpreter provided. No transportation provided   Colorectal Cancer Screening: Per patient has never had colonoscopy completed No complaints today. She is scheduled for colonoscopy on 02/28/23 with [facility].   Breast  and Cervical Cancer Risk Assessment: Patient has family history of breast cancer, with her  sister. Patient does not have history of cervical dysplasia, immunocompromised, or DES exposure in-utero.  Risk Assessment   No risk assessment data for the current encounter  Risk Scores       10/30/2021   Last edited by: Ann Chris, RN   5-year risk: 1.5 %   Lifetime risk: 11.7 %            A: BCCCP exam without pap smear No complaints with benign exam.  P: [facility] to [facility] for [facility] screening mammogram. Appointment scheduled 11/11/22.  Ann Lux, NP 11/11/2022 9:47 AM

## 2022-11-11 NOTE — Patient Instructions (Signed)
Taught Ann Wallace about self breast awareness and gave educational materials to take home. Patient did not need a Pap smear today due to last Pap smear was in 06/21/2021 per patient. Due again in 2027. Let her know BCCCP will cover Pap smears every 5 years unless has a history of abnormal Pap smears. Referred patient to the [facility] [place] for screening mammogram. Appointment scheduled for 11/11/2022. Patient aware of appointment and will be there. Let patient know will follow up with her within the next couple weeks with results. Ann Wallace verbalized understanding.  Pascal Lux, NP 10:13 AM

## 2023-06-11 ENCOUNTER — Other Ambulatory Visit: Payer: Self-pay | Admitting: Family Medicine

## 2023-06-11 DIAGNOSIS — Z111 Encounter for screening for respiratory tuberculosis: Secondary | ICD-10-CM

## 2023-06-13 ENCOUNTER — Ambulatory Visit
Admission: RE | Admit: 2023-06-13 | Discharge: 2023-06-13 | Disposition: A | Discharge status: Home / Self Care | Payer: 59 | Source: Ambulatory Visit | Attending: Family Medicine | Admitting: Family Medicine

## 2023-06-13 ENCOUNTER — Ambulatory Visit
Admission: RE | Admit: 2023-06-13 | Discharge: 2023-06-13 | Disposition: A | Discharge status: Home / Self Care | Payer: 59 | Attending: Family Medicine | Admitting: Family Medicine

## 2023-06-13 DIAGNOSIS — Z111 Encounter for screening for respiratory tuberculosis: Secondary | ICD-10-CM

## 2023-07-10 ENCOUNTER — Telehealth: Payer: Self-pay

## 2023-07-24 ENCOUNTER — Ambulatory Visit (LOCAL_COMMUNITY_HEALTH_CENTER): Payer: Self-pay

## 2023-07-24 DIAGNOSIS — Z227 Latent tuberculosis: Secondary | ICD-10-CM | POA: Insufficient documentation

## 2023-07-25 NOTE — Progress Notes (Signed)
EPI completed via phone. 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 Patient voiced concern during phone interview; why are you asking me all of these questions? Explained that TB RN determines risk factors for TB through the information needed. Patient states she is on medication/supplements to lose weight and for a yeast infection. When asked what the names were she stated that the meds/supplements weren't prescription and not from here. Patient wishes to proceed with LTBI tx in April once she has finished medications. TB RN added patient to f/u list. Patient stated that she would call in March for appt. Will get an accurate weight and height when the patient returns to clinic for tx. LTBI vs Active info and TB contact info mailed to patient. Richmond Campbell, RN

## 2023-07-25 NOTE — Telephone Encounter (Signed)
EPI completed. Patient would like to start LTBI tx in April Richmond Campbell, RN

## 2023-09-29 ENCOUNTER — Other Ambulatory Visit: Payer: Self-pay | Admitting: Family Medicine

## 2023-09-29 DIAGNOSIS — Z1231 Encounter for screening mammogram for malignant neoplasm of breast: Secondary | ICD-10-CM

## 2023-09-30 ENCOUNTER — Telehealth: Payer: Self-pay

## 2023-09-30 ENCOUNTER — Other Ambulatory Visit: Payer: Self-pay

## 2023-09-30 NOTE — Telephone Encounter (Signed)
 Phone call to patient to follow-up LTBI treatment recommendation. (See TB clinic nurse note from 07/25/2023).   Information reviewed with patient today: EPI initial intake 07/25/2023, no changes  on 09/30/2023.  Denies currently taking medications. She is planning to start taking a natural colon cleansing product and multivitamin. Not sure when she will initiate this natural cleansing process at home. Patient was reminded importance to notify TB clinic nurse if she starts taking any medication or over the counter product during LTBI therapy to check for possible interaction or contraindication. Patient states understanding. LTBI treatment options reviewed, reminded importance of active TB disease prevention. Understands this optional treatment is highly recommended, and If she desires to initiate treatment is provided free of charges to her at the county [facility] residence [facility] department TB clinic. Patient agrees.  LTBI medication management appointment scheduled 10/07/2023 at 10:00 am.   Advised to notify TB clinic nurse if unable to keep appointment or any further questions or concerns regarding this matter.  TB nurse contact phone number and clinic address send by text message.  Patient states understanding and agrees with plan. Lavinia Sharps, RN

## 2023-09-30 NOTE — Progress Notes (Signed)
 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   Diagnosis of LTBI and pertinent labs/info: QFT: Positive 06/06/2023 CXR: No active cardiopulmonary disease. 06/26/2023 EPI: 07/25/2023  HIV: Offer HIV test at initial visit Syphilis: Offer at initial visit.  CBC: Per TB provider recommendation. LFTs:  Per TB provider recommendation.   LTBI therapy start date: 10/07/2023  Tuberculosis treatment orders  All patients are to be monitored per [place] and county TB policies.   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 Treat for latent TB per the following:  Rifampin 600 mg by mouth daily x 4 months per standing orders ([place]) per Dr. Irena Cords.   If baseline labs are obtained, and are within normal limits, no additional monthly labs are indicated. Only will needs additional labs if concerning symptoms arise, new potentially hepatotoxic medications, or significant alcohol consumption is reported that would require monitoring. Lavinia Sharps, RN

## 2023-10-07 ENCOUNTER — Ambulatory Visit: Payer: Self-pay

## 2023-10-07 ENCOUNTER — Other Ambulatory Visit: Payer: Self-pay

## 2023-10-07 VITALS — Wt 192.3 lb

## 2023-10-07 DIAGNOSIS — Z227 Latent tuberculosis: Secondary | ICD-10-CM

## 2023-10-07 LAB — HM HIV SCREENING LAB: HM HIV Screening: NEGATIVE

## 2023-10-07 MED ORDER — RIFAMPIN 300 MG PO CAPS
600.0000 mg | ORAL_CAPSULE | Freq: Every day | ORAL | Status: AC
Start: 2023-10-07 — End: 2023-11-06

## 2023-10-07 NOTE — Progress Notes (Signed)
 Initial visit for LTBI treatment:  Rifampin #1 Medication written education material given and reviewed with patient.  LTBI treatment consent obtained.  Patient reports taking: Dietary supplements for sleeping: Lime tree, [place] orange, damiana, bay leaf, passion flower, rosmarine, valerian, seaweed, St. John's wort, lyophilized royaljelly. Patient takes 2 capsules daily. reveiwed by Dr. Alyn Babe. Ok to continue taking and notify TB clinic nurse if any side effects or unexpected symptoms.  Patient advised to notify primary care provider or TB clinic nurse if she plans to start taking any additional supplements or over the counter medications. Patient reminded that some natural herbals or supplements can cause significant interaction with Rifampin.  Medication list reviewed, denies currently taking any prescribed medication. Rifampin 300 mg. #60 dispensed. Appointment for Rifampin #2 scheduled 11/04/2023 at 9:30 am. and reminder card given. HIV and syphilis screening test obtained.  TB clinic nurse contact  phone number given to notify any side effects or concerns regarding her treatment.  Patient states understanding and agrees, . Michele Ahle, RN

## 2023-11-04 ENCOUNTER — Other Ambulatory Visit: Payer: Self-pay

## 2023-11-04 ENCOUNTER — Ambulatory Visit (LOCAL_COMMUNITY_HEALTH_CENTER): Payer: Self-pay

## 2023-11-04 VITALS — Wt 195.0 lb

## 2023-11-04 DIAGNOSIS — Z227 Latent tuberculosis: Secondary | ICD-10-CM

## 2023-11-04 MED ORDER — RIFAMPIN 300 MG PO CAPS
600.0000 mg | ORAL_CAPSULE | Freq: Every day | ORAL | Status: AC
Start: 2023-11-04 — End: 2023-12-04

## 2023-11-04 NOTE — Progress Notes (Signed)
 Visit for LTBI treatment: Rifampin  #2 TB flowsheet complete.  Rifampin  300 mg. #60 capsules dispensed.  Follow-up appointment scheduled 12/04/2023 for Rifampin  #3.  Patient advised to contact TB clinic nurse if he develops any TB symptoms or medication side effects at [phone]. Patient states understanding and agrees.  Michele Ahle, RN

## 2023-11-12 ENCOUNTER — Encounter

## 2023-12-04 ENCOUNTER — Ambulatory Visit (LOCAL_COMMUNITY_HEALTH_CENTER): Payer: Self-pay

## 2023-12-04 ENCOUNTER — Other Ambulatory Visit: Payer: Self-pay

## 2023-12-04 DIAGNOSIS — Z227 Latent tuberculosis: Secondary | ICD-10-CM

## 2023-12-04 MED ORDER — RIFAMPIN 300 MG PO CAPS: 600.0000 mg | ORAL_CAPSULE | Freq: Every day | ORAL | Status: AC

## 2023-12-04 NOTE — Progress Notes (Signed)
 TB clinic visit for LTBI treatment: Rifampin  #3. TB flowsheet completed. Patient c/o h/a, weakness and nausea after daily dose, when taken early morning on an empty stomach.  Advised to take medication later during the day, about 1 - 2 after a meal.  Rifampin  300 mg. #60 dispensed, advised to take 2 capsules at the same time daily.  CBC and Hepatic function panel obtained. Note assigned to Dr. Alyn Babe.  Appointment for Rifampin  #4 scheduled 01/01/2024, reminder card given. TB clinic nurse contact  phone number given to notify any side effects or concerns regarding her treatment.  Patient states understanding and agrees with plan. Michele Ahle, RN

## 2023-12-06 LAB — CBC WITH DIFFERENTIAL/PLATELET
Basophils Absolute: 0 10*3/uL (ref 0.0–0.2)
Basos: 1 %
EOS (ABSOLUTE): 0.1 10*3/uL (ref 0.0–0.4)
Eos: 3 %
Hematocrit: 44.3 % (ref 34.0–46.6)
Hemoglobin: 14.7 g/dL (ref 11.1–15.9)
Immature Grans (Abs): 0 10*3/uL (ref 0.0–0.1)
Immature Granulocytes: 0 %
Lymphocytes Absolute: 1.8 10*3/uL (ref 0.7–3.1)
Lymphs: 41 %
MCH: 31.3 pg (ref 26.6–33.0)
MCHC: 33.2 g/dL (ref 31.5–35.7)
MCV: 95 fL (ref 79–97)
Monocytes Absolute: 0.3 10*3/uL (ref 0.1–0.9)
Monocytes: 7 %
Neutrophils Absolute: 2 10*3/uL (ref 1.4–7.0)
Neutrophils: 48 %
Platelets: 235 10*3/uL (ref 150–450)
RBC: 4.69 x10E6/uL (ref 3.77–5.28)
RDW: 12.9 % (ref 11.7–15.4)
WBC: 4.3 10*3/uL (ref 3.4–10.8)

## 2023-12-06 LAB — HEPATIC FUNCTION PANEL
ALT: 19 IU/L (ref 0–32)
AST: 15 IU/L (ref 0–40)
Albumin: 4.4 g/dL (ref 3.8–4.9)
Alkaline Phosphatase: 76 IU/L (ref 44–121)
Bilirubin Total: 0.4 mg/dL (ref 0.0–1.2)
Bilirubin, Direct: 0.2 mg/dL (ref 0.00–0.40)
Total Protein: 7.1 g/dL (ref 6.0–8.5)

## 2023-12-09 ENCOUNTER — Ambulatory Visit: Payer: Self-pay | Admitting: Surgery

## 2024-01-01 ENCOUNTER — Ambulatory Visit

## 2024-01-01 VITALS — Wt 198.0 lb

## 2024-01-01 DIAGNOSIS — Z227 Latent tuberculosis: Secondary | ICD-10-CM

## 2024-01-01 MED ORDER — RIFAMPIN 300 MG PO CAPS: 600.0000 mg | ORAL_CAPSULE | Freq: Every day | ORAL | Status: AC

## 2024-01-01 NOTE — Progress Notes (Signed)
 In nurse clinic for LTBI / TB Med Completion / Rifampin  # 4  / Labs: none Interpreter M [place]  Taking Rifampin  as prescribed.  States she has missed one dose this month Per Patient, approx 8 pills (4 day supply)remaining in current bottle.  Advised to complete this bottle before starting bottle that will be dispensed today.   Patient explains she takes Rifampin  at 7:30 am on empty stomach and eats 2 hrs later. Has some nausea, but resolves after eating breakfast.  RN suggested to patient to take Rifampin  with food or later in day, but patient states that taking med at 7:30 am is best for her as she will tend to forget to take it if it's later in day.   New meds: Takes Magnesium and Potassium daily when she remembers. Also takes trazodone prn for sleep. Tylenol prn for bone pain, last dose weeks ago  Denies health concerns today. Denies GI symptoms  Consult Dr Herlene and informed of patient status, meds, and mild nausea . Ok to continue Rifampin . No labs needed today. Encourage patient to push through side effects. Also encourage patient to try Rifampin  with food to ease nausea. Advised to contact [facility] with concerns.   RN discussed provider recommendations. Verbalizes understanding, though patient prefers to continue Rifampin  early morning as she has been.   Alcohol: denies. Advised to abstain from alcohol as Rifampin  can damage the liver.   BCM: post menopausal  The patient was dispensed Rifampin  300 mg #60(bottle #4) today per order by Dr JINNY Herlene. I provided counseling today regarding the medication. We discussed the medication, the side effects and when to call clinic. Patient given the opportunity to ask questions. Questions answered.    TB contact card given and advised to contact with questions, concerns, side effects.  Rifampin  information sheet given and reviewed.  TB completion card given and reviewed.  TB completion letter for patient (English and [place]) given and reviewed.    PCP Dr Stefano Lease at [facility].  ROI signed.  PCP TB completion letter successfully faxed to PCP. Jedediah Noda, RN

## 2024-04-29 ENCOUNTER — Telehealth: Payer: Self-pay

## 2024-05-25 ENCOUNTER — Other Ambulatory Visit: Payer: Self-pay | Admitting: Family Medicine

## 2024-05-25 DIAGNOSIS — Z1231 Encounter for screening mammogram for malignant neoplasm of breast: Secondary | ICD-10-CM

## 2024-06-09 ENCOUNTER — Inpatient Hospital Stay
Admission: RE | Admit: 2024-06-09 | Discharge: 2024-06-09 | Payer: Self-pay | Attending: Family Medicine | Admitting: Family Medicine

## 2024-06-09 DIAGNOSIS — Z1231 Encounter for screening mammogram for malignant neoplasm of breast: Secondary | ICD-10-CM | POA: Insufficient documentation
